# Patient Record
Sex: Female | Born: 1970 | Race: White | Hispanic: No | Marital: Married | Smoking: Never smoker
Health system: Southern US, Community
[De-identification: ages and names within clinical notes are randomized; demographics above are authoritative.]

---

## 2005-09-15 ENCOUNTER — Observation Stay: Payer: Self-pay | Admitting: Certified Nurse Midwife

## 2005-11-26 ENCOUNTER — Ambulatory Visit: Payer: Self-pay | Admitting: Obstetrics and Gynecology

## 2006-01-10 ENCOUNTER — Inpatient Hospital Stay: Payer: Self-pay | Admitting: Obstetrics and Gynecology

## 2006-07-10 ENCOUNTER — Ambulatory Visit: Payer: Self-pay | Admitting: Obstetrics and Gynecology

## 2008-01-19 ENCOUNTER — Other Ambulatory Visit: Payer: Self-pay

## 2008-01-19 ENCOUNTER — Emergency Department: Payer: Self-pay | Admitting: Emergency Medicine

## 2011-09-23 ENCOUNTER — Ambulatory Visit: Payer: Self-pay | Admitting: Obstetrics and Gynecology

## 2012-09-23 ENCOUNTER — Ambulatory Visit: Payer: Self-pay | Admitting: Obstetrics and Gynecology

## 2013-09-24 ENCOUNTER — Ambulatory Visit: Payer: Self-pay | Admitting: Obstetrics and Gynecology

## 2014-09-26 ENCOUNTER — Ambulatory Visit: Payer: Self-pay | Admitting: Obstetrics and Gynecology

## 2015-08-22 ENCOUNTER — Other Ambulatory Visit: Payer: Self-pay | Admitting: Obstetrics and Gynecology

## 2015-08-22 DIAGNOSIS — Z1231 Encounter for screening mammogram for malignant neoplasm of breast: Secondary | ICD-10-CM

## 2015-09-28 ENCOUNTER — Ambulatory Visit
Admission: RE | Admit: 2015-09-28 | Discharge: 2015-09-28 | Disposition: A | Discharge status: Home / Self Care | Payer: BLUE CROSS/BLUE SHIELD | Source: Ambulatory Visit | Attending: Obstetrics and Gynecology | Admitting: Obstetrics and Gynecology

## 2015-09-28 DIAGNOSIS — Z1231 Encounter for screening mammogram for malignant neoplasm of breast: Secondary | ICD-10-CM | POA: Insufficient documentation

## 2016-03-29 ENCOUNTER — Other Ambulatory Visit: Payer: Self-pay | Admitting: Obstetrics and Gynecology

## 2016-03-29 DIAGNOSIS — N6459 Other signs and symptoms in breast: Secondary | ICD-10-CM

## 2016-04-12 ENCOUNTER — Ambulatory Visit
Admission: RE | Admit: 2016-04-12 | Discharge: 2016-04-12 | Disposition: A | Discharge status: Home / Self Care | Payer: BLUE CROSS/BLUE SHIELD | Source: Ambulatory Visit | Attending: Obstetrics and Gynecology | Admitting: Obstetrics and Gynecology

## 2016-04-12 DIAGNOSIS — N6459 Other signs and symptoms in breast: Secondary | ICD-10-CM

## 2016-08-26 ENCOUNTER — Other Ambulatory Visit: Payer: Self-pay | Admitting: Obstetrics and Gynecology

## 2016-08-26 DIAGNOSIS — Z1231 Encounter for screening mammogram for malignant neoplasm of breast: Secondary | ICD-10-CM

## 2016-10-01 ENCOUNTER — Ambulatory Visit
Admission: RE | Admit: 2016-10-01 | Discharge: 2016-10-01 | Disposition: A | Discharge status: Home / Self Care | Payer: BLUE CROSS/BLUE SHIELD | Source: Ambulatory Visit | Attending: Obstetrics and Gynecology | Admitting: Obstetrics and Gynecology

## 2016-10-01 DIAGNOSIS — Z1231 Encounter for screening mammogram for malignant neoplasm of breast: Secondary | ICD-10-CM | POA: Diagnosis present

## 2017-06-07 ENCOUNTER — Ambulatory Visit
Admission: EM | Admit: 2017-06-07 | Discharge: 2017-06-07 | Disposition: A | Discharge status: Home / Self Care | Payer: BLUE CROSS/BLUE SHIELD | Attending: Emergency Medicine | Admitting: Emergency Medicine

## 2017-06-07 ENCOUNTER — Encounter: Payer: Self-pay | Admitting: Emergency Medicine

## 2017-06-07 DIAGNOSIS — H6982 Other specified disorders of Eustachian tube, left ear: Secondary | ICD-10-CM

## 2017-06-07 DIAGNOSIS — H60392 Other infective otitis externa, left ear: Secondary | ICD-10-CM

## 2017-06-07 DIAGNOSIS — H6992 Unspecified Eustachian tube disorder, left ear: Secondary | ICD-10-CM

## 2017-06-07 MED ORDER — HYDROCORTISONE-ACETIC ACID 1-2 % OT SOLN: 3.0000 [drp] | Freq: Three times a day (TID) | OTIC | 0 refills | Status: AC

## 2017-06-07 NOTE — ED Triage Notes (Signed)
Patient c/o cold symptoms for the past week.  Patient c/o left ear pain that started yesterday.  Patient denies recent fevers.

## 2017-06-07 NOTE — Discharge Instructions (Signed)
Flonase daily as instructed for 2-3 weeks

## 2017-06-07 NOTE — ED Provider Notes (Signed)
[facility]    CSN: [id number] Arrival date & time: 06/07/17  0808     History   Chief Complaint Chief Complaint  Patient presents with  . Otalgia    left ear    HPI Joyce Hanna is a 46 y.o. female.   HPI  46 year old female who presents with left ear pain that started yesterday. She states that she's had a cold symptoms for the last 8 days. The colon was largely subsided but her ear pain has increased. She states it is very itchy and also tender when she pushes on the outside. She's had no drainage. She has had mild tinnitus. She wears an ear piece in the left ear all day long.  History reviewed. No pertinent past medical history.  There are no active problems to display for this patient.   History reviewed. No pertinent surgical history.  OB History    No data available       Home Medications    Prior to Admission medications   Medication Sig Start Date End Date Taking? Authorizing Provider  atorvastatin (LIPITOR) 20 MG tablet Take 20 mg by mouth daily.   Yes [provider]  acetic acid-hydrocortisone (VOSOL-HC) OTIC solution Place 3 drops into the left ear 3 (three) times daily. 06/07/17   Lutricia Feiloemer, Breandan People P, PA-C    Family History Family History  Problem Relation Age of Onset  . Hyperlipidemia Mother   . Hypertension Father   . Breast cancer Neg Hx     Social History Social History  Substance Use Topics  . Smoking status: Never Smoker  . Smokeless tobacco: Never Used  . Alcohol use No     Allergies   Patient has no known allergies.   Review of Systems Review of Systems  Constitutional: Negative for activity change, chills, fatigue and fever.  HENT: Positive for ear pain. Negative for ear discharge.   All other systems reviewed and are negative.    Physical Exam Triage Vital Signs ED Triage Vitals  Enc Vitals Group     BP 06/07/17 0822 130/78     Pulse Rate 06/07/17 0822 72     Resp 06/07/17 0822 14     Temp  06/07/17 0822 98.8 F (37.1 C)     Temp Source 06/07/17 0822 Oral     SpO2 06/07/17 0822 99 %     Weight 06/07/17 0819 198 lb 6.6 oz (90 kg)     Height 06/07/17 0819 5\' 4"  (1.626 m)     Head Circumference --      Peak Flow --      Pain Score 06/07/17 0820 4     Pain Loc --      Pain Edu? --      Excl. in GC? --    No data found.   Updated Vital Signs BP 130/78 (BP Location: Left Arm)   Pulse 72   Temp 98.8 F (37.1 C) (Oral)   Resp 14   Ht 5\' 4"  (1.626 m)   Wt 198 lb 6.6 oz (90 kg)   LMP 05/19/2017 (Exact Date)   SpO2 99%   BMI 34.06 kg/m   Visual Acuity Right Eye Distance:   Left Eye Distance:   Bilateral Distance:    Right Eye Near:   Left Eye Near:    Bilateral Near:     Physical Exam  Constitutional: She is oriented to person, place, and time. She appears well-developed and well-nourished. No distress.  HENT:  Head: Normocephalic.  Right Ear: External ear normal.  Nose: Nose normal.  Mouth/Throat: Oropharynx is clear and moist. No oropharyngeal exudate.  Examination of the left ear canal shows mild occlusion with fluffy debris that is white. There is no discharge noticed. TM appears dark. She has pain with movement of the tragus.  Eyes: Pupils are equal, round, and reactive to light. Right eye exhibits no discharge. Left eye exhibits no discharge.  Neck: Normal range of motion. Neck supple.  Pulmonary/Chest: Effort normal and breath sounds normal.  Musculoskeletal: Normal range of motion.  Lymphadenopathy:    She has no cervical adenopathy.  Neurological: She is alert and oriented to person, place, and time.  Skin: Skin is warm and dry. She is not diaphoretic.  Psychiatric: She has a normal mood and affect. Her behavior is normal. Judgment and thought content normal.  Nursing note and vitals reviewed.    UC Treatments / Results  Labs (all labs ordered are listed, but only abnormal results are displayed) Labs Reviewed - No data to display  EKG  EKG  Interpretation None       Radiology No results found.  Procedures Procedures (including critical care time)  Medications Ordered in UC Medications - No data to display   Initial Impression / Assessment and Plan / UC Course  I have reviewed the triage vital signs and the nursing notes.  Pertinent labs & imaging results that were available during my care of the patient were reviewed by me and considered in my medical decision making (see chart for details).     Plan: 1. Test/x-ray results and diagnosis reviewed with patient 2. rx as per orders; risks, benefits, potential side effects reviewed with patient 3. Recommend supportive treatment with use of ear drops as prescribed. Have also recommended the use of Flonase daily for 2-3 weeks because of eustachian tube dysfunction. I've recommended that she change her ear piece on head set that she wears at work. She is not improving she should follow-up with her primary care physician or ENT 4. F/u prn if symptoms worsen or don't improve   Final Clinical Impressions(s) / UC Diagnoses   Final diagnoses:  Other infective acute otitis externa of left ear  Eustachian tube dysfunction, left    New Prescriptions Discharge Medication List as of 06/07/2017  8:45 AM    START taking these medications   Details  acetic acid-hydrocortisone (VOSOL-HC) OTIC solution Place 3 drops into the left ear 3 (three) times daily., Starting Sat 06/07/2017, Normal       Flonase daily for 2 weeks  Controlled Substance Prescriptions [place] Controlled Substance Registry consulted? Not Applicable   Lutricia Feil, PA-C 06/07/17 1610

## 2017-09-29 ENCOUNTER — Other Ambulatory Visit: Payer: Self-pay | Admitting: Obstetrics & Gynecology

## 2017-09-29 DIAGNOSIS — Z1231 Encounter for screening mammogram for malignant neoplasm of breast: Secondary | ICD-10-CM

## 2017-10-16 ENCOUNTER — Ambulatory Visit
Admission: RE | Admit: 2017-10-16 | Discharge: 2017-10-16 | Disposition: A | Discharge status: Home / Self Care | Payer: BLUE CROSS/BLUE SHIELD | Source: Ambulatory Visit | Attending: Obstetrics & Gynecology | Admitting: Obstetrics & Gynecology

## 2017-10-16 DIAGNOSIS — Z1231 Encounter for screening mammogram for malignant neoplasm of breast: Secondary | ICD-10-CM | POA: Diagnosis not present

## 2018-10-08 ENCOUNTER — Other Ambulatory Visit: Payer: Self-pay | Admitting: Obstetrics & Gynecology

## 2018-10-08 DIAGNOSIS — Z1231 Encounter for screening mammogram for malignant neoplasm of breast: Secondary | ICD-10-CM

## 2019-01-14 ENCOUNTER — Other Ambulatory Visit: Payer: Self-pay

## 2019-01-14 ENCOUNTER — Ambulatory Visit
Admission: RE | Admit: 2019-01-14 | Discharge: 2019-01-14 | Disposition: A | Discharge status: Home / Self Care | Payer: BC Managed Care – PPO | Source: Ambulatory Visit | Attending: Obstetrics & Gynecology | Admitting: Obstetrics & Gynecology

## 2019-01-14 DIAGNOSIS — Z1231 Encounter for screening mammogram for malignant neoplasm of breast: Secondary | ICD-10-CM | POA: Insufficient documentation

## 2019-10-12 ENCOUNTER — Other Ambulatory Visit: Payer: Self-pay | Admitting: Obstetrics & Gynecology

## 2019-10-12 ENCOUNTER — Other Ambulatory Visit: Payer: Self-pay

## 2019-10-12 DIAGNOSIS — Z1231 Encounter for screening mammogram for malignant neoplasm of breast: Secondary | ICD-10-CM

## 2020-01-17 ENCOUNTER — Ambulatory Visit
Admission: RE | Admit: 2020-01-17 | Discharge: 2020-01-17 | Disposition: A | Discharge status: Home / Self Care | Payer: BC Managed Care – PPO | Source: Ambulatory Visit | Attending: Obstetrics & Gynecology | Admitting: Obstetrics & Gynecology

## 2020-01-17 DIAGNOSIS — Z1231 Encounter for screening mammogram for malignant neoplasm of breast: Secondary | ICD-10-CM | POA: Diagnosis not present

## 2020-10-31 ENCOUNTER — Other Ambulatory Visit: Payer: Self-pay | Admitting: Obstetrics & Gynecology

## 2020-10-31 DIAGNOSIS — Z1231 Encounter for screening mammogram for malignant neoplasm of breast: Secondary | ICD-10-CM

## 2021-01-17 ENCOUNTER — Other Ambulatory Visit: Payer: Self-pay

## 2021-01-17 ENCOUNTER — Ambulatory Visit
Admission: RE | Admit: 2021-01-17 | Discharge: 2021-01-17 | Disposition: A | Discharge status: Home / Self Care | Payer: BC Managed Care – PPO | Source: Ambulatory Visit | Attending: Obstetrics & Gynecology | Admitting: Obstetrics & Gynecology

## 2021-01-17 DIAGNOSIS — Z1231 Encounter for screening mammogram for malignant neoplasm of breast: Secondary | ICD-10-CM | POA: Insufficient documentation

## 2021-07-23 IMAGING — MG DIGITAL SCREENING BILAT W/ TOMO W/ CAD
8 series · 8 of 24 positions shown · non-contrast
Comparison: Previous exam(s).

CLINICAL DATA: Screening.

EXAM:
DIGITAL SCREENING BILATERAL MAMMOGRAM WITH TOMO AND CAD

[R MLO synth-2D]
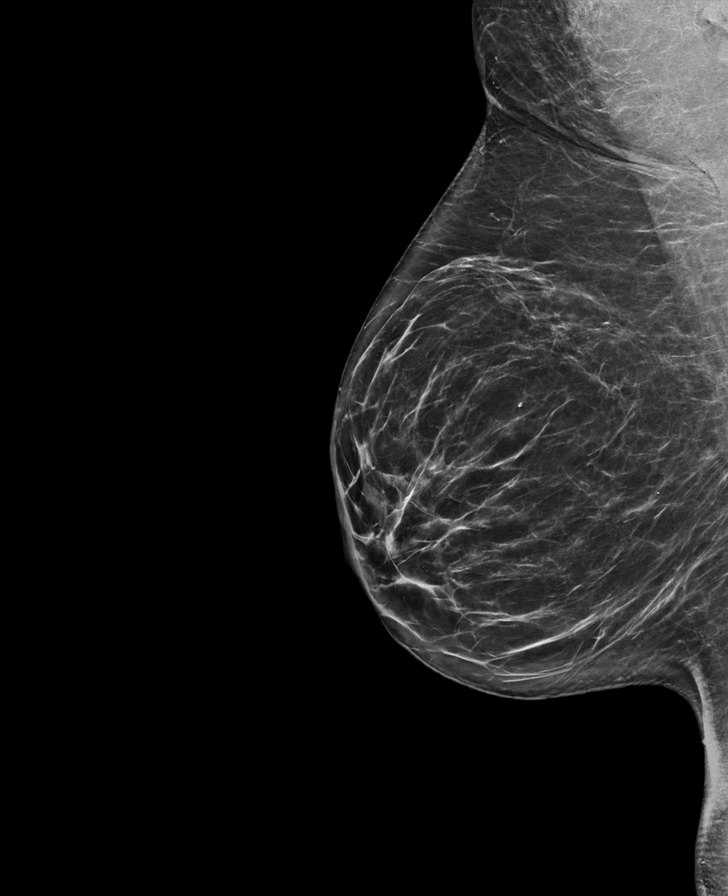

[R CC synth-2D]
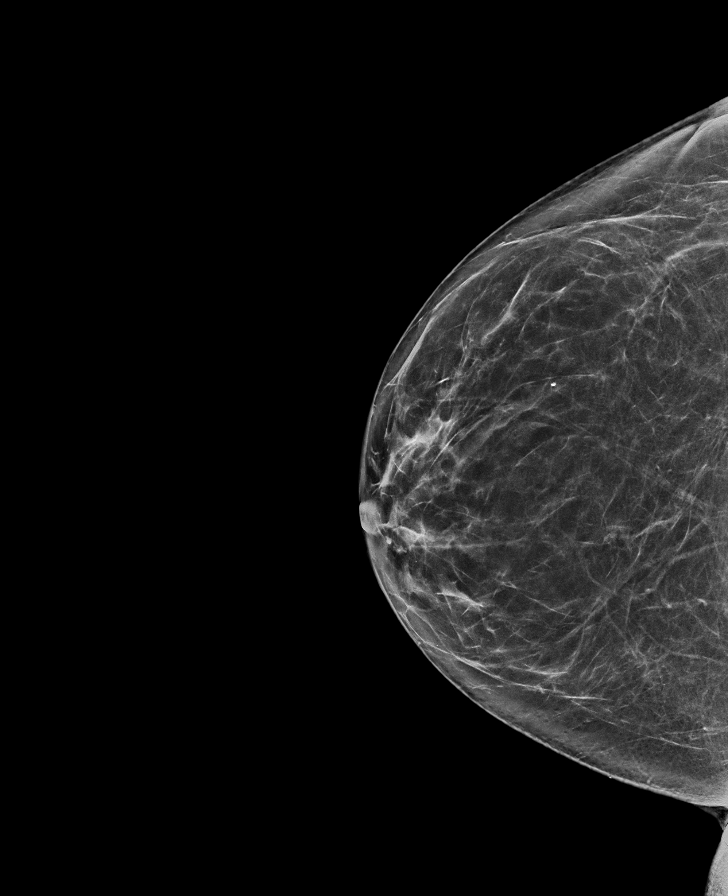

[L MLO synth-2D]
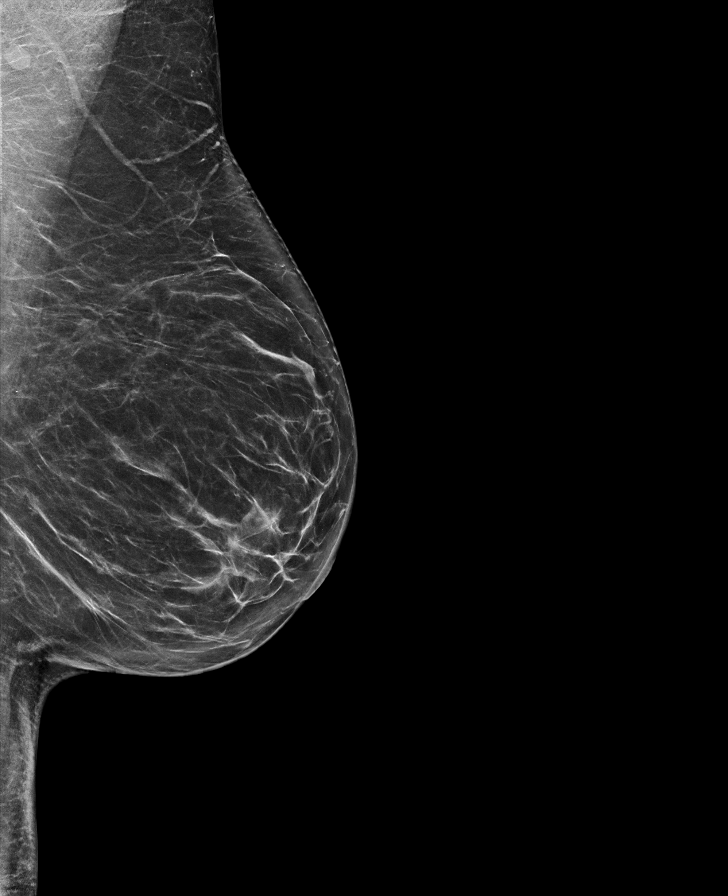

[L CC synth-2D]
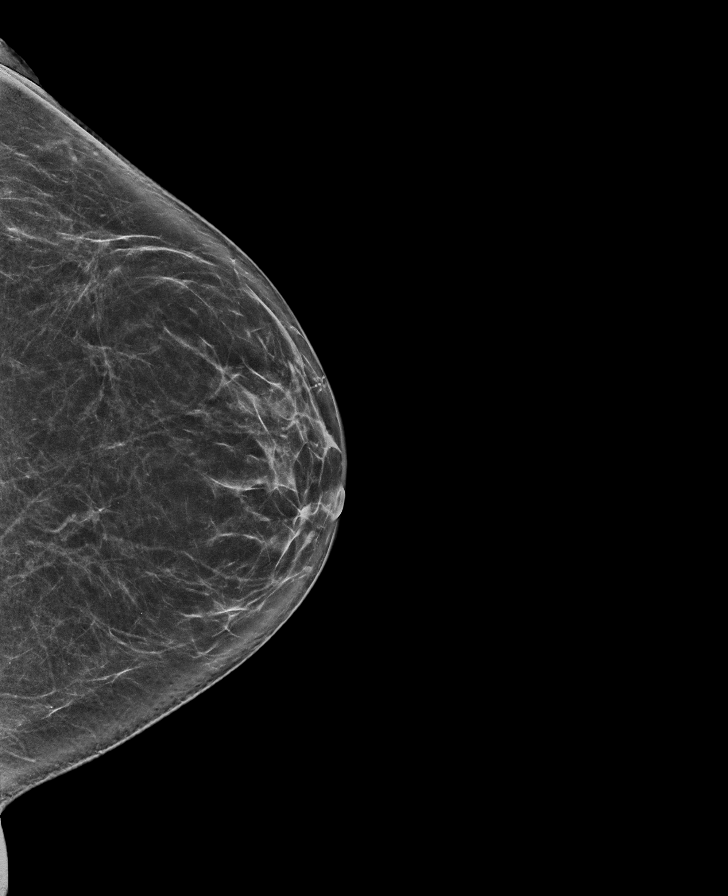

[R MLO tomo · tomo slice 39/78.0]
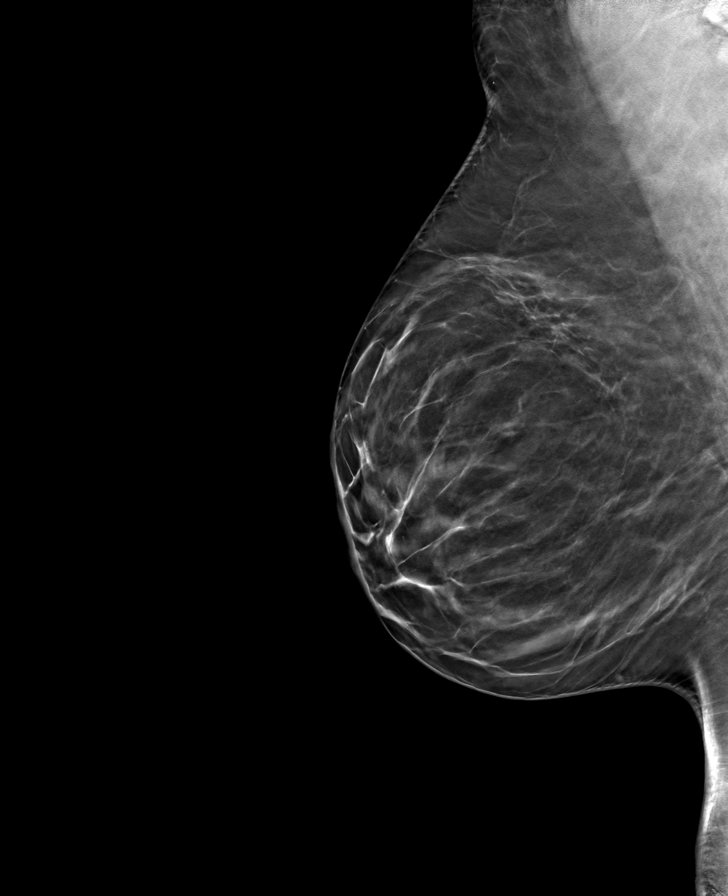

[L MLO tomo · tomo slice 39/77.0]
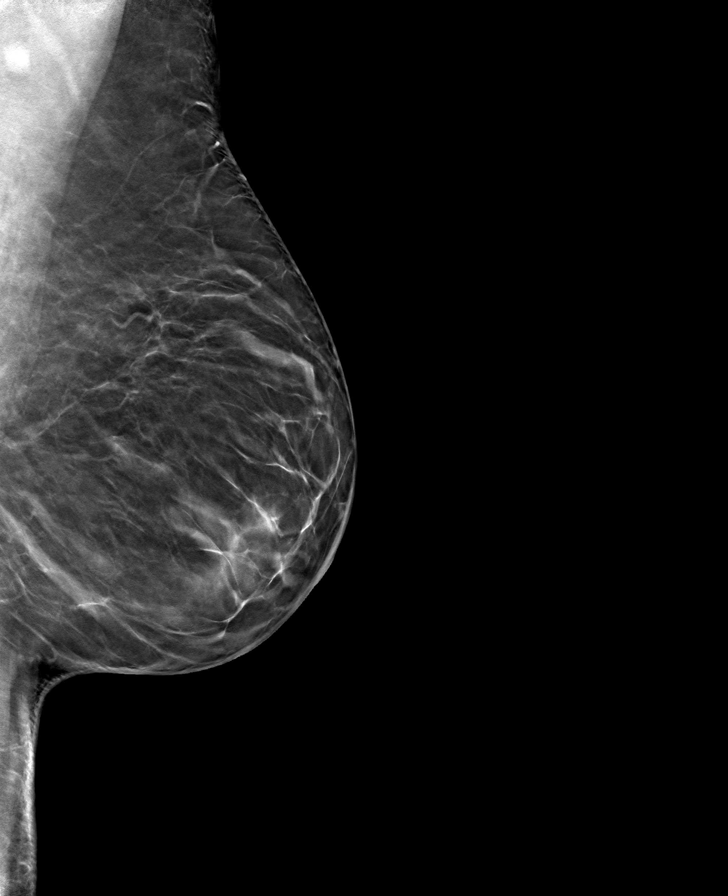

[R CC tomo · tomo slice 39/77.0]
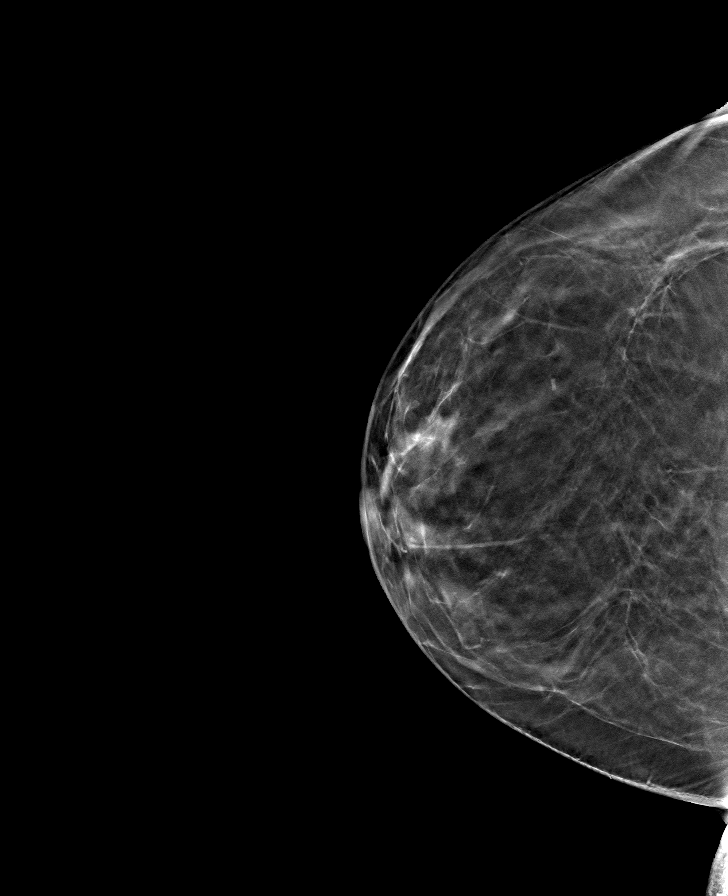

[L CC tomo · tomo slice 37/73.0]
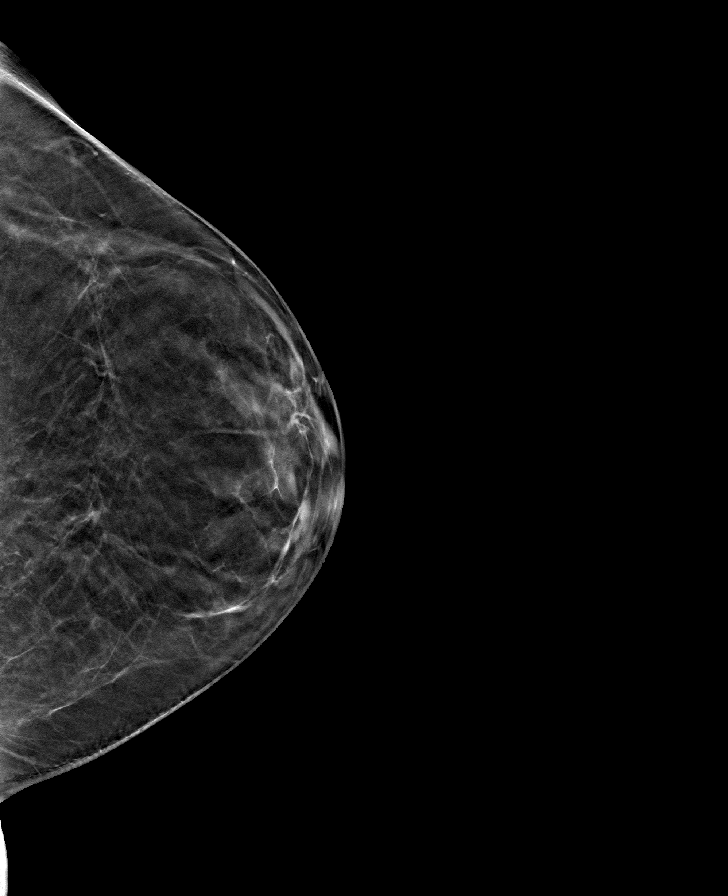

[8 of 24 positions shown; findings below may reference images not displayed]

ACR Breast Density Category b: There are scattered areas of
fibroglandular density.
FINDINGS: There are no findings suspicious for malignancy. Images were
processed with CAD.
IMPRESSION: No mammographic evidence of malignancy. A result letter of this
screening mammogram will be mailed directly to the patient.

RECOMMENDATION:
Screening mammogram in one year. (Code:[id number])

BI-RADS CATEGORY  1: Negative.

## 2021-11-15 ENCOUNTER — Other Ambulatory Visit: Payer: Self-pay | Admitting: Obstetrics and Gynecology

## 2021-11-15 DIAGNOSIS — Z1231 Encounter for screening mammogram for malignant neoplasm of breast: Secondary | ICD-10-CM

## 2022-01-18 ENCOUNTER — Ambulatory Visit
Admission: RE | Admit: 2022-01-18 | Discharge: 2022-01-18 | Disposition: A | Discharge status: Home / Self Care | Payer: BC Managed Care – PPO | Source: Ambulatory Visit | Attending: Obstetrics and Gynecology | Admitting: Obstetrics and Gynecology

## 2022-01-18 DIAGNOSIS — Z1231 Encounter for screening mammogram for malignant neoplasm of breast: Secondary | ICD-10-CM | POA: Insufficient documentation

## 2022-01-23 ENCOUNTER — Other Ambulatory Visit: Payer: Self-pay | Admitting: Obstetrics and Gynecology

## 2022-01-23 DIAGNOSIS — R928 Other abnormal and inconclusive findings on diagnostic imaging of breast: Secondary | ICD-10-CM

## 2022-02-08 ENCOUNTER — Ambulatory Visit
Admission: RE | Admit: 2022-02-08 | Discharge: 2022-02-08 | Disposition: A | Discharge status: Home / Self Care | Payer: BC Managed Care – PPO | Source: Ambulatory Visit | Attending: Obstetrics and Gynecology | Admitting: Obstetrics and Gynecology

## 2022-02-08 DIAGNOSIS — R928 Other abnormal and inconclusive findings on diagnostic imaging of breast: Secondary | ICD-10-CM

## 2022-11-15 ENCOUNTER — Other Ambulatory Visit: Payer: Self-pay | Admitting: Obstetrics and Gynecology

## 2022-11-15 DIAGNOSIS — Z1231 Encounter for screening mammogram for malignant neoplasm of breast: Secondary | ICD-10-CM

## 2023-02-11 ENCOUNTER — Ambulatory Visit
Admission: RE | Admit: 2023-02-11 | Discharge: 2023-02-11 | Disposition: A | Discharge status: Home / Self Care | Payer: BC Managed Care – PPO | Source: Ambulatory Visit | Attending: Obstetrics and Gynecology | Admitting: Obstetrics and Gynecology

## 2023-02-11 DIAGNOSIS — Z1231 Encounter for screening mammogram for malignant neoplasm of breast: Secondary | ICD-10-CM | POA: Insufficient documentation

## 2023-11-20 ENCOUNTER — Other Ambulatory Visit: Payer: Self-pay | Admitting: Obstetrics and Gynecology

## 2023-11-20 DIAGNOSIS — Z1231 Encounter for screening mammogram for malignant neoplasm of breast: Secondary | ICD-10-CM

## 2024-02-12 ENCOUNTER — Ambulatory Visit
Admission: RE | Admit: 2024-02-12 | Discharge: 2024-02-12 | Disposition: A | Discharge status: Home / Self Care | Source: Ambulatory Visit | Attending: Obstetrics and Gynecology | Admitting: Obstetrics and Gynecology

## 2024-02-12 DIAGNOSIS — Z1231 Encounter for screening mammogram for malignant neoplasm of breast: Secondary | ICD-10-CM | POA: Insufficient documentation
